# Patient Record
Sex: Female | Born: 1990 | Hispanic: Yes | Marital: Single
Health system: Southern US, Community
[De-identification: ages and names within clinical notes are randomized; demographics above are authoritative.]

---

## 2019-01-01 ENCOUNTER — Other Ambulatory Visit (HOSPITAL_COMMUNITY): Payer: Self-pay

## 2019-01-01 DIAGNOSIS — Z3689 Encounter for other specified antenatal screening: Secondary | ICD-10-CM

## 2019-01-01 DIAGNOSIS — Z3A18 18 weeks gestation of pregnancy: Secondary | ICD-10-CM

## 2019-01-19 ENCOUNTER — Encounter (HOSPITAL_COMMUNITY): Payer: Self-pay | Admitting: *Deleted

## 2019-01-21 ENCOUNTER — Ambulatory Visit (HOSPITAL_COMMUNITY): Payer: Medicaid Other

## 2019-01-21 ENCOUNTER — Other Ambulatory Visit: Payer: Medicaid Other

## 2019-01-21 ENCOUNTER — Other Ambulatory Visit: Payer: Self-pay

## 2019-01-21 ENCOUNTER — Ambulatory Visit (HOSPITAL_COMMUNITY)
Admission: RE | Admit: 2019-01-21 | Discharge: 2019-01-21 | Disposition: A | Discharge status: Home / Self Care | Payer: Medicaid Other | Source: Ambulatory Visit | Attending: Obstetrics and Gynecology | Admitting: Obstetrics and Gynecology

## 2019-01-21 DIAGNOSIS — Z363 Encounter for antenatal screening for malformations: Secondary | ICD-10-CM | POA: Diagnosis not present

## 2019-01-21 DIAGNOSIS — Z369 Encounter for antenatal screening, unspecified: Secondary | ICD-10-CM

## 2019-01-21 DIAGNOSIS — Z3A18 18 weeks gestation of pregnancy: Secondary | ICD-10-CM | POA: Insufficient documentation

## 2019-01-21 DIAGNOSIS — Z3689 Encounter for other specified antenatal screening: Secondary | ICD-10-CM | POA: Insufficient documentation

## 2019-01-22 LAB — MATERNIT 21 PLUS CORE, BLOOD

## 2019-01-25 LAB — MATERNIT 21 PLUS CORE, BLOOD
Fetal Fraction: 7
Result (T21): NEGATIVE
Trisomy 13 (Patau syndrome): NEGATIVE
Trisomy 18 (Edwards syndrome): NEGATIVE
Trisomy 21 (Down syndrome): NEGATIVE

## 2019-01-26 ENCOUNTER — Telehealth (HOSPITAL_COMMUNITY): Payer: Self-pay | Admitting: *Deleted

## 2019-01-26 NOTE — Telephone Encounter (Signed)
Calling with results of Maternity21.  LM for patient to return call.

## 2019-01-26 NOTE — Telephone Encounter (Signed)
Patients name and DOB verified. Negative results of Maternity21 given.

## 2020-09-07 IMAGING — US US MFM OB COMP + 14 WK
1 series · 13 of 28 positions shown · non-contrast
Comparison: none

[Series 1: us mfm ob comp + 14 wk · 103 acquisitions, 13 frames shown]
[im 4/103]
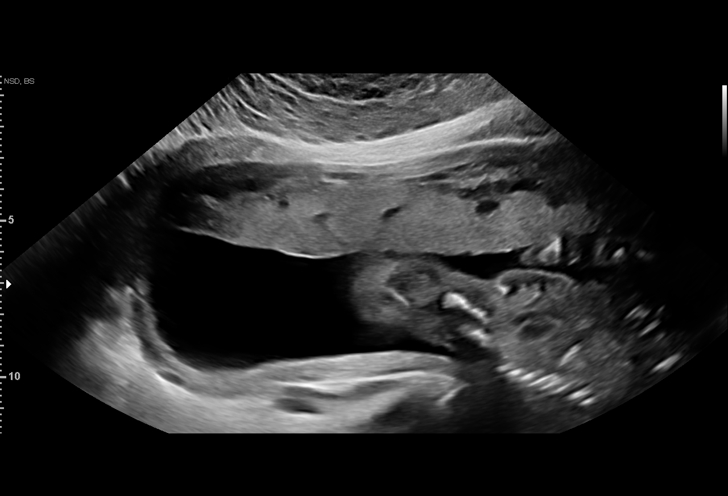
[im 12/103]
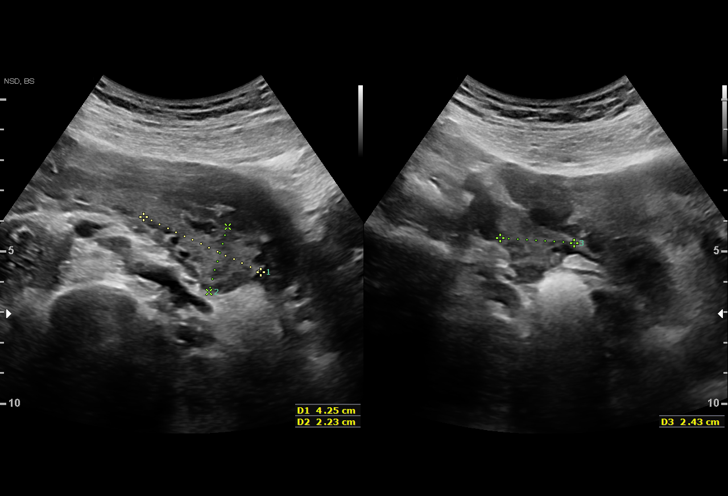
[im 19/103]
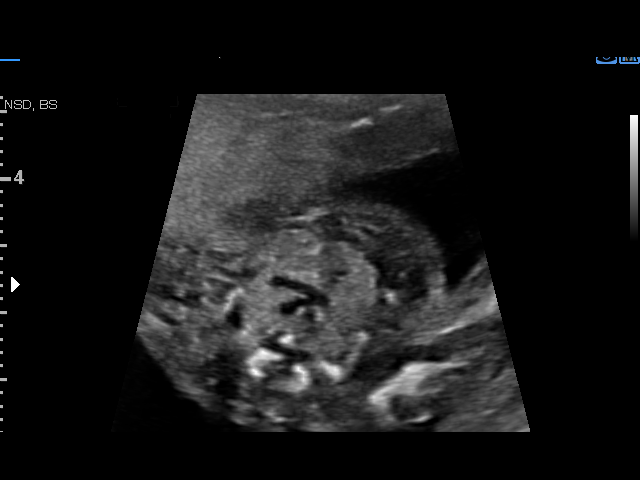
[im 27/103]
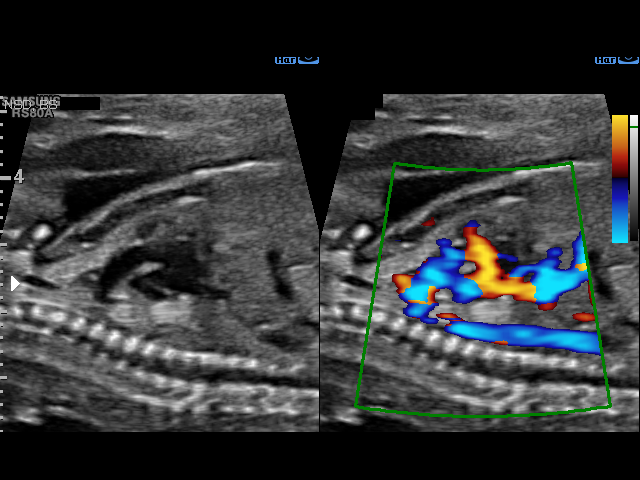
[im 35/103]
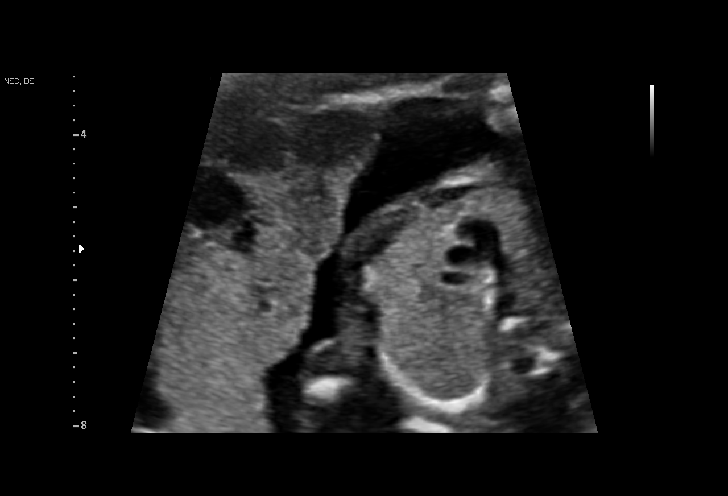
[im 42/103]
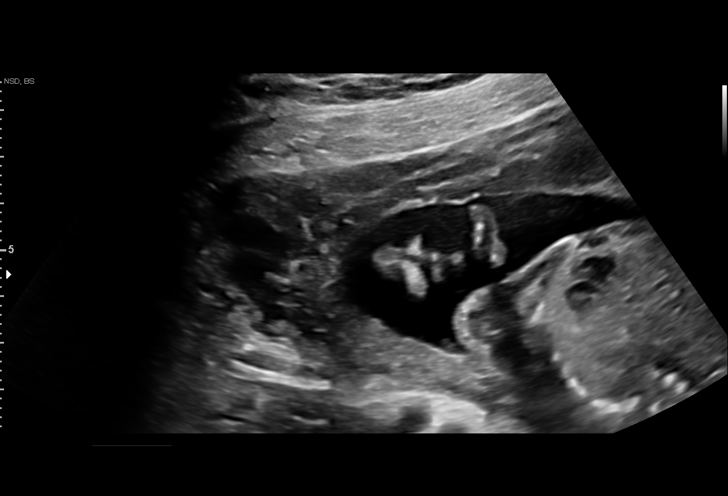
[im 53/103]
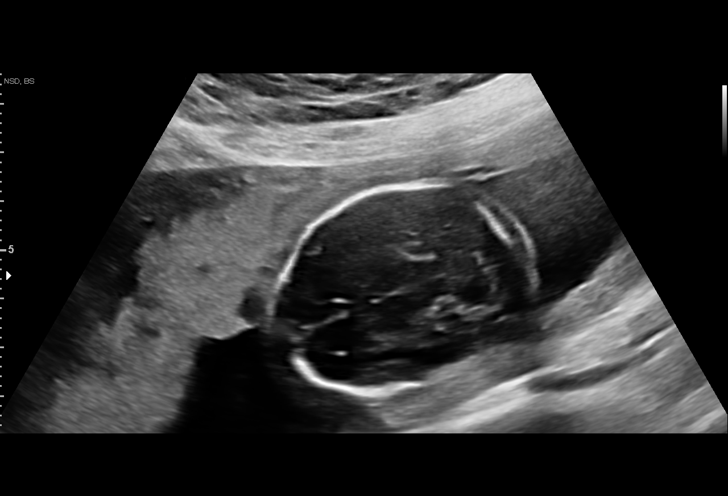
[im 61/103]
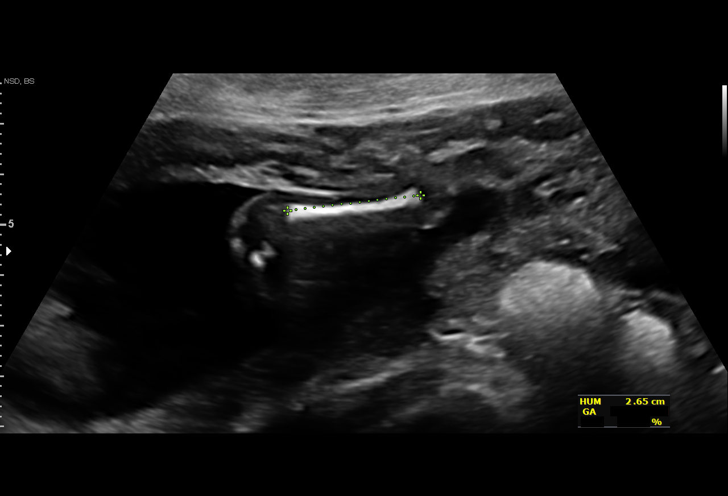
[im 69/103]
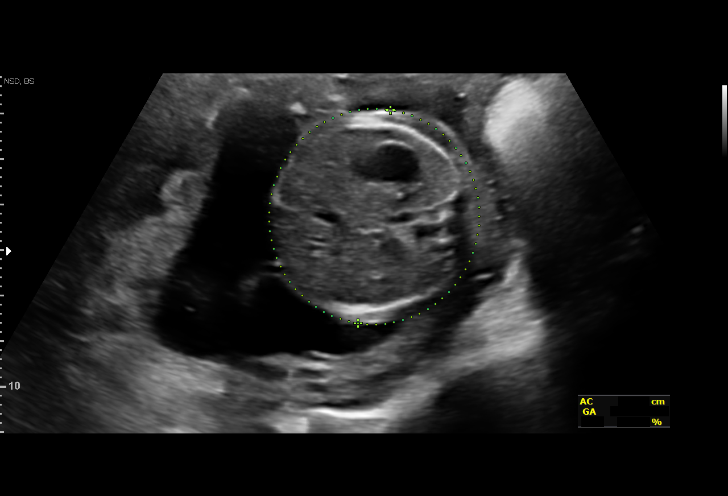
[im 76/103]
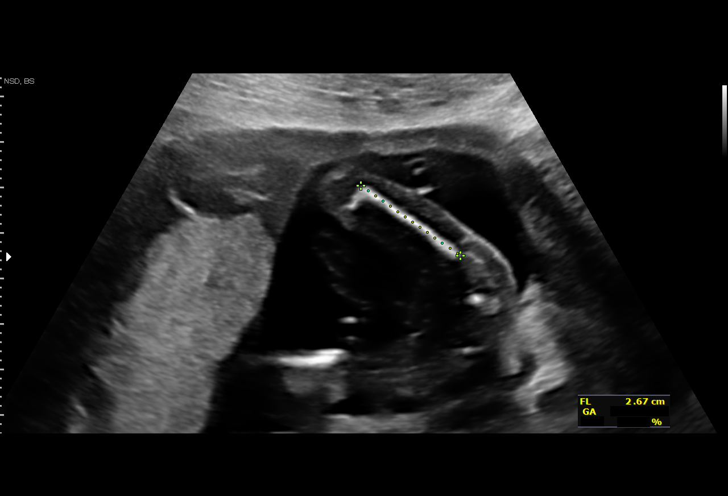
[im 84/103]
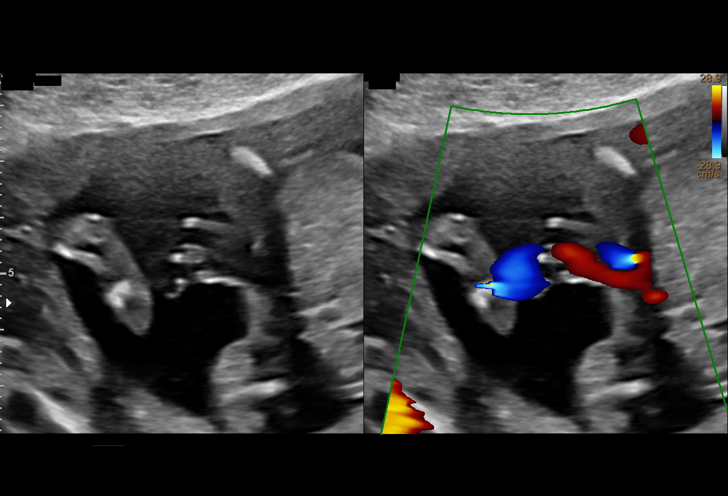
[im 91/103]
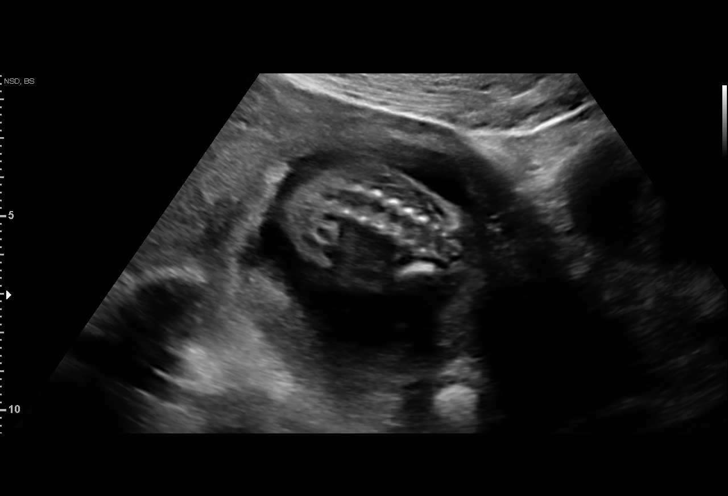
[im 99/103]
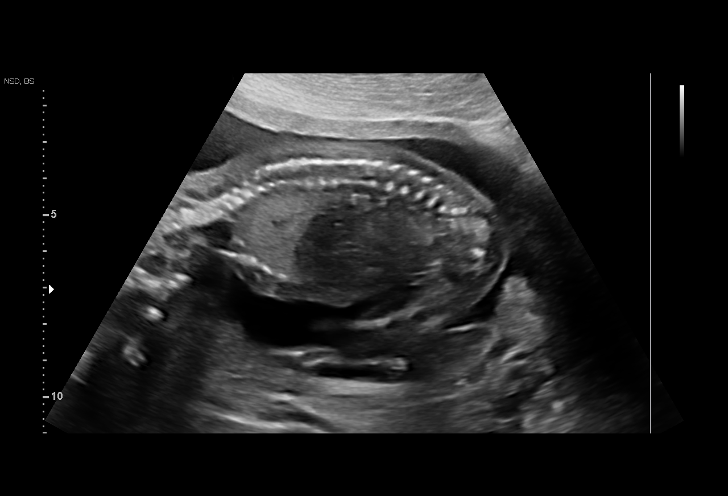

[13 of 28 positions shown; findings below may reference images not displayed]

3227 [REDACTED]

  1  US MFM OB COMP + 14 WK               76805.01     MOJEED HASSY
 ----------------------------------------------------------------------

 ----------------------------------------------------------------------
Indications

  Encounter for antenatal screening for
  malformations
  18 weeks gestation of pregnancy
 ----------------------------------------------------------------------
Fetal Evaluation

 Num Of Fetuses:          1
 Fetal Heart Rate(bpm):   147
 Cardiac Activity:        Observed
 Presentation:            Breech
 Placenta:                Anterior
 P. Cord Insertion:       Visualized, central

 Amniotic Fluid
 AFI FV:      Within normal limits

                             Largest Pocket(cm)

Biometry

 BPD:      41.4  mm     G. Age:  18w 4d         56  %    CI:          70.5  %    70 - 86
                                                         FL/HC:       16.9  %    15.8 - 18
 HC:      157.2  mm     G. Age:  18w 5d         52  %    HC/AC:       1.09       1.07 -
 AC:      144.7  mm     G. Age:  19w 5d         86  %    FL/BPD:      64.0  %
 FL:       26.5  mm     G. Age:  18w 0d         31  %    FL/AC:       18.3  %    20 - 24
 HUM:      26.5  mm     G. Age:  18w 2d         52  %
 CER:        19  mm     G. Age:  18w 4d         53  %
 NFT:       3.6  mm

 LV:        7.6  mm
 CM:        4.2  mm
 Est. FW:     265   gm     0 lb 9 oz     55  %
OB History

 Gravidity:    1         Term:   0        Prem:   0        SAB:   0
 TOP:          0       Ectopic:  0        Living: 0
Gestational Age

 LMP:           18w 3d        Date:  09/14/18                 EDD:   06/21/19
 U/S Today:     18w 5d                                        EDD:   06/19/19
 Best:          18w 3d     Det. By:  LMP  (09/14/18)          EDD:   06/21/19
Anatomy

 Cranium:               Appears normal         LVOT:                   Appears normal
 Cavum:                 Appears normal         Aortic Arch:            Appears normal
 Ventricles:            Appears normal         Ductal Arch:            Appears normal
 Choroid Plexus:        Appears normal         Diaphragm:              Appears normal
 Cerebellum:            Appears normal         Stomach:                Appears normal, left
                                                                       sided
 Posterior Fossa:       Appears normal         Abdomen:                Appears normal
 Nuchal Fold:           Appears normal         Abdominal Wall:         Appears nml (cord
                                                                       insert, abd wall)
 Face:                  Appears normal         Cord Vessels:           Appears normal (3
                        (orbits and profile)                           vessel cord)
 Lips:                  Appears normal         Kidneys:                Appear normal
 Palate:                Not well visualized    Bladder:                Appears normal
 Thoracic:              Appears normal         Spine:                  Appears normal
 Heart:                 Appears normal         Upper Extremities:      Appears normal
                        (4CH, axis, and
                        situs)
 RVOT:                  Appears normal         Lower Extremities:      Appears normal

 Other:  Parents do not wish to know sex of fetus. Heels and 5th digit
         visualized.
Cervix Uterus Adnexa

 Cervix
 Length:           3.41  cm.
 Normal appearance by transabdominal scan.

 Left Ovary
 Within normal limits.

 Right Ovary
 Within normal limits.

 Adnexa
 No abnormality visualized.
Impression

 We performed fetal anatomy scan. No makers of
 aneuploidies or fetal structural defects are seen. Fetal
 biometry is consistent with her previously-established dates.
 Amniotic fluid is normal and good fetal activity is seen.
 Patient understands the limitations of ultrasound in detecting
 fetal anomalies.
 Cell-free fetal DNA screening results are pending.
Recommendations

 Follow-up as clinically indicated.
                 Kesler, Nakia
